# Patient Record
Sex: Male | Born: 1980 | Race: White | Hispanic: No | Marital: Married | Smoking: Never smoker
Health system: Southern US, Community
[De-identification: ages and names within clinical notes are randomized; demographics above are authoritative.]

## PROBLEM LIST (undated history)

## (undated) HISTORY — PX: KNEE SURGERY: SHX244

## (undated) HISTORY — PX: OTHER SURGICAL HISTORY: SHX169

---

## 2005-05-19 ENCOUNTER — Emergency Department (HOSPITAL_COMMUNITY): Admission: EM | Admit: 2005-05-19 | Discharge: 2005-05-19 | Payer: Self-pay | Admitting: Emergency Medicine

## 2008-01-21 ENCOUNTER — Emergency Department (HOSPITAL_COMMUNITY): Admission: EM | Admit: 2008-01-21 | Discharge: 2008-01-21 | Payer: Self-pay | Admitting: Emergency Medicine

## 2009-06-17 ENCOUNTER — Encounter: Admission: RE | Admit: 2009-06-17 | Discharge: 2009-06-17 | Payer: Self-pay | Admitting: Orthopedic Surgery

## 2014-06-13 ENCOUNTER — Emergency Department (HOSPITAL_COMMUNITY): Payer: BC Managed Care – PPO

## 2014-06-13 ENCOUNTER — Encounter (HOSPITAL_COMMUNITY): Payer: Self-pay | Admitting: Emergency Medicine

## 2014-06-13 ENCOUNTER — Emergency Department (HOSPITAL_COMMUNITY)
Admission: EM | Admit: 2014-06-13 | Discharge: 2014-06-13 | Disposition: A | Discharge status: Home / Self Care | Payer: BC Managed Care – PPO | Attending: Emergency Medicine | Admitting: Emergency Medicine

## 2014-06-13 DIAGNOSIS — Y929 Unspecified place or not applicable: Secondary | ICD-10-CM | POA: Diagnosis not present

## 2014-06-13 DIAGNOSIS — S99919A Unspecified injury of unspecified ankle, initial encounter: Secondary | ICD-10-CM | POA: Diagnosis present

## 2014-06-13 DIAGNOSIS — Y9389 Activity, other specified: Secondary | ICD-10-CM | POA: Diagnosis not present

## 2014-06-13 DIAGNOSIS — M25562 Pain in left knee: Secondary | ICD-10-CM

## 2014-06-13 DIAGNOSIS — R000 Tachycardia, unspecified: Secondary | ICD-10-CM | POA: Diagnosis not present

## 2014-06-13 DIAGNOSIS — S8990XA Unspecified injury of unspecified lower leg, initial encounter: Secondary | ICD-10-CM | POA: Insufficient documentation

## 2014-06-13 DIAGNOSIS — Z9889 Other specified postprocedural states: Secondary | ICD-10-CM | POA: Insufficient documentation

## 2014-06-13 DIAGNOSIS — S99929A Unspecified injury of unspecified foot, initial encounter: Principal | ICD-10-CM

## 2014-06-13 DIAGNOSIS — X500XXA Overexertion from strenuous movement or load, initial encounter: Secondary | ICD-10-CM | POA: Diagnosis not present

## 2014-06-13 MED ORDER — OXYCODONE-ACETAMINOPHEN 5-325 MG PO TABS: 1.0000 | ORAL_TABLET | ORAL | Status: DC | PRN

## 2014-06-13 NOTE — Discharge Instructions (Signed)
Continue to take ibuprofen for pain. Elevate, apply ice and wear the knee immobilizer for comfort. Do not drive while taking the narcotic pain medication as it will make you sleepy. Follow up with Dr. Thomasena Edis as planned.

## 2014-06-13 NOTE — ED Provider Notes (Signed)
CSN: [id number]     Arrival date & time 06/13/14  1635 History   First MD Initiated Contact with Patient 06/13/14 1732     Chief Complaint  Patient presents with  . Knee Injury     (Consider location/radiation/quality/duration/timing/severity/associated sxs/prior Treatment) Patient is a 33 y.o. male presenting with knee pain. The history is provided by the patient.  Knee Pain Location:  Knee Time since incident:  45 minutes Knee location:  L knee Pain details:    Quality:  Shooting   Radiates to:  Does not radiate   Onset quality:  Sudden   Timing:  Constant Chronicity:  New Dislocation: yes   Foreign body present:  No foreign bodies Prior injury to area:  No Relieved by:  Nothing Worsened by:  Bearing weight Ineffective treatments:  None tried Stanley Cox is a 33 y.o. male who presents to the ED with left knee pain. He states that approximately 45 minutes prior to arrival to the ED he was getting out of a golf cart and felt his knee "pop out". He was able to pop it back in. Patient has history of same and has had surgery by Dr. Thomasena Edis on the right knee. He continues to have pain and swelling of the left knee.   History reviewed. No pertinent past medical history. Past Surgical History  Procedure Laterality Date  . Knee surgery Right   . Hand Right    History reviewed. No pertinent family history. History  Substance Use Topics  . Smoking status: Never Smoker   . Smokeless tobacco: Not on file  . Alcohol Use: Yes     Comment: occasional    Review of Systems Negative except as stated in HPI   Allergies  Review of patient's allergies indicates not on file.  Home Medications   Prior to Admission medications   Not on File   BP 125/92  Pulse 104  Temp(Src) 98.4 F (36.9 C) (Oral)  Resp 16  Ht  (1.778 m)  Wt 215 lb (97.523 kg)  BMI 30.85 kg/m2  SpO2 100% Physical Exam  Nursing note and vitals reviewed. Constitutional: He is oriented to person,  place, and time. He appears well-developed and well-nourished. No distress.  HENT:  Head: Normocephalic.  Eyes: EOM are normal.  Neck: Neck supple.  Cardiovascular: Tachycardia present.   Pulmonary/Chest: Effort normal.  Musculoskeletal:       Left knee: He exhibits swelling. He exhibits normal range of motion, no ecchymosis, no deformity, no laceration, no erythema and normal alignment. Tenderness found.       Legs: Pedal pulse strong, adequate circulation, good touch sensation. Full flexion and extension. Able to do straight leg raises.   Neurological: He is alert and oriented to person, place, and time. No cranial nerve deficit.  Skin: Skin is warm and dry.  Psychiatric: He has a normal mood and affect. His behavior is normal.    ED Course  Procedures (including critical care time) Labs Review Labs Reviewed - No data to display  Imaging Review Dg Knee Complete 4 Views Left  06/13/2014   CLINICAL DATA:  Left knee pain. Twisted is knee. History of patellar dislocation.  EXAM: LEFT KNEE - COMPLETE 4+ VIEW  COMPARISON:  None.  FINDINGS: The knee is located. The patella is located. There is no significant joint effusion. No acute osseous abnormality is evident.  IMPRESSION: Negative left knee radiographs.   Electronically Signed   By: Gennette Pac M.D.   On:  06/13/2014 17:11   MDM  33 y.o. male with left knee pain s/p injury prior to arrival to the ED. Placed in knee immobilizer, ice, elevation and follow up with Dr. Thomasena Edis. Stable for discharge and remains neurovascularly intact.    [facility] Orlene Och, [place] 06/14/14 [phone]

## 2014-06-13 NOTE — ED Notes (Signed)
Pt reports left knee injury approximately 45 minutes ago. Pt reports felt knee "pop" out and pt reports "popped" back in. No obvious deformity noted in triage. Pt reports increased pain with weight bearing.nad noted.

## 2014-06-13 NOTE — ED Notes (Signed)
Patient with no complaints at this time. Respirations even and unlabored. Skin warm/dry. Discharge instructions reviewed with patient at this time. Patient given opportunity to voice concerns/ask questions. Patient discharged at this time and left Emergency Department with steady gait.   

## 2014-06-14 NOTE — ED Provider Notes (Signed)
Medical screening examination/treatment/procedure(s) were performed by non-physician practitioner and as supervising physician I was immediately available for consultation/collaboration.   EKG Interpretation None        Mete Purdum L Joli Koob, MD 06/14/14 1739 

## 2014-12-11 ENCOUNTER — Other Ambulatory Visit: Payer: Self-pay | Admitting: Orthopedic Surgery

## 2014-12-11 DIAGNOSIS — M545 Low back pain, unspecified: Secondary | ICD-10-CM

## 2014-12-14 ENCOUNTER — Ambulatory Visit
Admission: RE | Admit: 2014-12-14 | Discharge: 2014-12-14 | Disposition: A | Discharge status: Home / Self Care | Payer: 59 | Source: Ambulatory Visit | Attending: Orthopedic Surgery | Admitting: Orthopedic Surgery

## 2014-12-14 DIAGNOSIS — M545 Low back pain, unspecified: Secondary | ICD-10-CM

## 2015-05-08 ENCOUNTER — Encounter (HOSPITAL_COMMUNITY): Payer: Self-pay | Admitting: *Deleted

## 2015-05-08 ENCOUNTER — Emergency Department (HOSPITAL_COMMUNITY)
Admission: EM | Admit: 2015-05-08 | Discharge: 2015-05-08 | Disposition: A | Discharge status: Home / Self Care | Payer: 59 | Attending: Emergency Medicine | Admitting: Emergency Medicine

## 2015-05-08 DIAGNOSIS — Y9389 Activity, other specified: Secondary | ICD-10-CM | POA: Insufficient documentation

## 2015-05-08 DIAGNOSIS — S00561A Insect bite (nonvenomous) of lip, initial encounter: Secondary | ICD-10-CM | POA: Insufficient documentation

## 2015-05-08 DIAGNOSIS — W57XXXA Bitten or stung by nonvenomous insect and other nonvenomous arthropods, initial encounter: Secondary | ICD-10-CM | POA: Insufficient documentation

## 2015-05-08 DIAGNOSIS — Y998 Other external cause status: Secondary | ICD-10-CM | POA: Diagnosis not present

## 2015-05-08 DIAGNOSIS — Y9289 Other specified places as the place of occurrence of the external cause: Secondary | ICD-10-CM | POA: Insufficient documentation

## 2015-05-08 MED ORDER — LIDOCAINE VISCOUS 2 % MT SOLN: 20.0000 mL | OROMUCOSAL | Status: AC | PRN

## 2015-05-08 NOTE — ED Notes (Signed)
Patient stung on L lower lip by wasp about an hour ago.  Significant swelling noted to L lower lip.

## 2015-05-08 NOTE — ED Provider Notes (Signed)
CSN: [id number]     Arrival date & time 05/08/15  1547 History   First MD Initiated Contact with Patient 05/08/15 1553     Chief Complaint  Patient presents with  . Insect Bite     (Consider location/radiation/quality/duration/timing/severity/associated sxs/prior Treatment) Patient is a 34 y.o. male presenting with animal bite.  Animal Bite Contact animal:  Insect Location:  Mouth Mouth injury location:  Lower outer lip Time since incident:  1 hour Pain details:    Quality:  Aching and sharp   Severity:  Mild   Timing:  Constant   Progression:  Worsening Incident location:  Home Provoked: unprovoked   Worsened by:  Nothing tried Ineffective treatments:  OTC medications Associated symptoms: no fever and no rash     History reviewed. No pertinent past medical history. Past Surgical History  Procedure Laterality Date  . Knee surgery Right   . Hand Right    History reviewed. No pertinent family history. History  Substance Use Topics  . Smoking status: Never Smoker   . Smokeless tobacco: Not on file  . Alcohol Use: Yes     Comment: occasional    Review of Systems  Constitutional: Negative for fever.  HENT:       Left lower lip swelling  Respiratory: Negative for cough, chest tightness, shortness of breath, wheezing and stridor.   Cardiovascular: Negative for chest pain.  Gastrointestinal: Negative for nausea, vomiting, abdominal pain and rectal pain.  Musculoskeletal: Negative for back pain and neck pain.  Skin: Negative for rash and wound.      Allergies  Review of patient's allergies indicates no known allergies.  Home Medications   Prior to Admission medications   Medication Sig Start Date End Date Taking? Authorizing Provider  ALPRAZolam Prudy Feeler) 1 MG tablet Take 1 tablet by mouth 2 (two) times daily as needed. anxiety 04/27/15  Yes Historical Provider, MD  naproxen sodium (ANAPROX) 220 MG tablet Take 440 mg by mouth 2 (two) times daily as needed (pain).    Yes Historical Provider, MD  lidocaine (XYLOCAINE) 2 % solution Use as directed 20 mLs in the mouth or throat every 3 (three) hours as needed for mouth pain. Swish around painful area for 15-20 seconds then spit out. 05/08/15   Marily Memos, MD  oxyCODONE-acetaminophen (ROXICET) 5-325 MG per tablet Take 1 tablet by mouth every 4 (four) hours as needed for severe pain. Patient not taking: Reported on 05/08/2015 06/13/14   Janne Napoleon, NP   BP 141/94 mmHg  Pulse 78  Temp(Src) 97.9 F (36.6 C) (Oral)  Resp 22  Ht  (1.803 m)  Wt 230 lb (104.327 kg)  BMI 32.09 kg/m2  SpO2 98% Physical Exam  Constitutional: He appears well-developed and well-nourished.  HENT:  Head: Atraumatic.  Left lower lip with significant swelling, no other airway involvement  Cardiovascular: Normal rate and regular rhythm.   Pulmonary/Chest: No respiratory distress. He has no wheezes. He has no rales.  Nursing note and vitals reviewed.   ED Course  Procedures (including critical care time) Labs Review Labs Reviewed - No data to display  Imaging Review No results found.   EKG Interpretation None      MDM   Final diagnoses:  Insect bite   Social mouth in the lip by a wasp just prior to arrival with his lip. On exam no obvious stridor antral swelling spread of the swelling. Observed immersed for an hour to ensure no progression and still with out any  dyspnea stridor, or difficulty breathing, or difficulty swallowing. It is likely a localized inflammatory reaction patient is stable for discharge home. Discussed with the patient signs of anaphylaxis and gave him a handout for the same and he will return for any signs or symptoms of anaphylactic reaction.  I have personally and contemperaneously reviewed labs and imaging and used in my decision making as above.   I feel the patient has had an appropriate workup for their chief complaint at this time and likelihood of emergent condition existing is low.  They have been counseled on decision, discharge, follow up and which symptoms necessitate immediate return to the emergency department. They or their family verbally stated understanding and agreement with plan and discharged in stable condition.       Marily Memos, MD 05/08/15 1725

## 2015-05-20 ENCOUNTER — Encounter: Payer: Self-pay | Admitting: Orthopedic Surgery

## 2015-05-26 ENCOUNTER — Ambulatory Visit (INDEPENDENT_AMBULATORY_CARE_PROVIDER_SITE_OTHER): Payer: 59 | Admitting: Orthopedic Surgery

## 2015-05-26 ENCOUNTER — Encounter: Payer: Self-pay | Admitting: Orthopedic Surgery

## 2015-05-26 VITALS — BP 130/89 | Ht 71.0 in | Wt 230.0 lb

## 2015-05-26 DIAGNOSIS — M545 Low back pain, unspecified: Secondary | ICD-10-CM | POA: Insufficient documentation

## 2015-05-26 DIAGNOSIS — M25369 Other instability, unspecified knee: Secondary | ICD-10-CM | POA: Diagnosis not present

## 2015-05-26 DIAGNOSIS — M25569 Pain in unspecified knee: Secondary | ICD-10-CM

## 2015-05-26 DIAGNOSIS — M75101 Unspecified rotator cuff tear or rupture of right shoulder, not specified as traumatic: Secondary | ICD-10-CM

## 2015-05-26 NOTE — Progress Notes (Signed)
Patient ID: Stanley Cox, male   DOB: 26-Feb-1981, 34 y.o.   MRN: [id number]  No chief complaint on file.   HPI Stanley Cox is a 34 y.o. male.  Presents for second opinion regarding his right knee and his right shoulder  Referred by Dr. Doristine Counter  We will start with the right shoulder. Pain 2 years no injury. Experiences catching and sharp stabbing pain in abduction external rotation. Pain is worse at night pain is worse with certain motions. Pain is 8-9 out of 10. Pain is worse when he lies on his right shoulder lifting certain objects.  Prior treatment 3 injections. Workup included x-ray and MRI MRI shows chronic tendinosis of the rotator cuff with no labral pathology.  Right knee multiple dislocations previous surgery x 2 = included lateral release medial reefing. Presents with pain swelling catching giving way loss of motion and inability to stand right knee.  Patient has also seen Dr. Shon Baton for pain in his hip/back which was worked up and found to be negative for any surgical pathology.  Right knee he says he can't stand or walk for long periods of time and has pain on steps going up hills and sitting in one place for a long time. Prior x-rays and MRIs been done he's had injections physical therapy and surgery as described. He says that Dr. Thomasena Edis has indicated he may need further surgery on his right and left knee  He wish to transfer care but we set this up as a second opinion.    Review of Systems Review of Systems 1. Sinusitis, numbness tingling burning pain in his legs 2. Joint pain muscle weakness gait disturbance stiff joints swollen joints back pain all other systems were negative   No past medical history on file.  Past Surgical History  Procedure Laterality Date  . Knee surgery Right   . Hand Right     Social History Social History  Substance Use Topics  . Smoking status: Never Smoker   . Smokeless tobacco: None  . Alcohol Use: Yes     Comment: occasional     No Known Allergies  Current Outpatient Prescriptions  Medication Sig Dispense Refill  . ALPRAZolam (XANAX) 1 MG tablet Take 1 tablet by mouth 2 (two) times daily as needed. anxiety    . naproxen sodium (ANAPROX) 220 MG tablet Take 440 mg by mouth 2 (two) times daily as needed (pain).    Marland Kitchen lidocaine (XYLOCAINE) 2 % solution Use as directed 20 mLs in the mouth or throat every 3 (three) hours as needed for mouth pain. Swish around painful area for 15-20 seconds then spit out. (Patient not taking: Reported on 05/26/2015) 200 mL 0  . oxyCODONE-acetaminophen (ROXICET) 5-325 MG per tablet Take 1 tablet by mouth every 4 (four) hours as needed for severe pain. (Patient not taking: Reported on 05/08/2015) 20 tablet 0   No current facility-administered medications for this visit.      Physical Exam Physical Exam Blood pressure 130/89, height  (1.803 m), weight 230 lb (104.327 kg).  Gen. appearance healthy-appearing male well-developed well-nourished The patient is alert and oriented person place and time Mood is normal affect is normal Ambulatory status normal  Exam of the lumbar spine midline lower back tenderness L5-S1 L4-5 right and left paraspinous muscle tenderness negative straight leg raises no range of motion restrictions  Inspection right shoulder pain with abduction external rotation decreased internal rotation versus a left shoulder by 5 levels ROM full passive  and active in both shoulders Stability normal apprehension in both shoulders Strength normal cuff strength on the left abnormal on the right supraspinatus  Skin: In both shoulders Pulses: Normal pulses both hands and wrists capillary refill Neuro: Normal neurologic exam bilaterally upper extremities   Data Reviewed MRI of the shoulder and of the knee were reviewed as stated above reports are reviewed and included. He also has multiple office notes and this documents a history that he gave.  Assessment    I see  several things here. He needs to have some therapy on his lumbar spine for his dysfunctional back. While it is nonsurgical he has not undergone adequate core strengthening and lumbar stabilization  Second thing I see is a chronic problem in the right knee. This will never be a normal knee. The cartilage transplant for the patella is probably not as successful as tibiofemoral cartilage transplants but he should entertain this thought and in any event because of his age and loss of function. He will never have a normal knee. 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  As far as his shoulder goes he has documented MRI that he does not have a cuff or labral tear. He seems to have classic chronic rotator cuff syndrome and rotator cuff dysfunction and needs rotator cuff strengthening he needs capsular stretching to address his internal rotation deficit  He will continue with [facility] orthopedics at this point and we will go ahead and order his therapy for his back and shoulder but we have not taken over his care    Plan    He will continue with [facility] orthopedics at this point and we will go ahead and order his therapy for his back and shoulder but we have not taken over his care        Fuller Canada 05/26/2015, 10:25 AM

## 2015-05-26 NOTE — Addendum Note (Signed)
Addended by: Vickki Hearing on: 05/26/2015 10:35 AM   Modules accepted: Orders

## 2015-06-10 ENCOUNTER — Encounter (HOSPITAL_COMMUNITY): Payer: Self-pay | Admitting: Occupational Therapy

## 2015-06-10 ENCOUNTER — Ambulatory Visit (HOSPITAL_COMMUNITY): Payer: 59 | Attending: Orthopedic Surgery | Admitting: Occupational Therapy

## 2015-06-10 DIAGNOSIS — M75101 Unspecified rotator cuff tear or rupture of right shoulder, not specified as traumatic: Secondary | ICD-10-CM

## 2015-06-10 DIAGNOSIS — M25511 Pain in right shoulder: Secondary | ICD-10-CM

## 2015-06-10 NOTE — Therapy (Signed)
[facility] [address] [place], [place], [address] Phone: [phone]   Fax:  [phone]  Occupational Therapy Evaluation  Patient Details  Name: Stanley Cox MRN: [id number] Date of Birth: 1980-12-31 Referring Provider:  Vickki Hearing, MD  Encounter Date: 06/10/2015      OT End of Session - 06/10/15 1245    Visit Number 1   Number of Visits 1   Date for OT Re-Evaluation 08/09/15   OT Start Time 0930   OT Stop Time 1008   OT Time Calculation (min) 38 min   Activity Tolerance Patient tolerated treatment well   Behavior During Therapy [facility] for tasks assessed/performed      History reviewed. No pertinent past medical history.  Past Surgical History  Procedure Laterality Date  . Knee surgery Right   . Hand Right     There were no vitals filed for this visit.  Visit Diagnosis:  Rotator cuff syndrome, right  Pain in right shoulder      Subjective Assessment - 06/10/15 1240    Subjective  S: Sometimes I get pain in my shoulder if I move it the wrong way.    Pertinent History Pt is a 34 y/o male with right rotator cuff syndrome presenting with pain in his right shoulder occurring intermittently. Pt reports one doctor said it was arthritis and one doctor thought it was a labrum tear. Pt went to Dr. Romeo Apple for second opinion.  Dr. Romeo Apple referred pt to occupational therapy evaluation and treatment.    Patient Stated Goals To lessen this pain   Currently in Pain? No/denies           [facility] OT Assessment - 06/10/15 0925    Assessment   Diagnosis Right rotator cuff syndrome   Onset Date --  approximately 2 years   Prior Therapy none   Precautions   Precautions Knee   Precaution Comments no standing for too long   Balance Screen   Has the patient fallen in the past 6 months No   Has the patient had a decrease in activity level because of a fear of falling?  No   Is the patient reluctant to leave their home  because of a fear of falling?  No   Home  Environment   Family/patient expects to be discharged to: Private residence   Living Arrangements Spouse/significant other   Prior Function   Level of Independence Independent with basic ADLs   Vocation On disability   Leisure golf   ADL   ADL comments Pt reports he is able to complete B/IADL tasks independently, however is pain limited at times. He states he has pain of 9-10 at night and in the mornings.    Written Expression   Dominant Hand Right   Cognition   Overall Cognitive Status Within Functional Limits for tasks assessed   ROM / Strength   AROM / PROM / Strength AROM;PROM;Strength   AROM   Overall AROM  Within functional limits for tasks performed   Overall AROM Comments WNL   AROM Assessment Site Shoulder   Right/Left Shoulder Right   PROM   Overall PROM Comments WNL   PROM Assessment Site Shoulder   Right/Left Shoulder Right   Strength   Strength Assessment Site Shoulder   Right/Left Shoulder Right   Right Shoulder Flexion 5/5   Right Shoulder ABduction 5/5   Right Shoulder Internal Rotation 5/5   Right Shoulder External Rotation 5/5  OT Treatments/Exercises (OP) - 06/10/15 1250    Exercises   Exercises Shoulder   Shoulder Exercises: Standing   Other Standing Exercises Educated pt on HEP including: shoulder stretching, red scapular theraband exercises, AROM exercises               OT Education - 06/10/15 1244    Education provided Yes   Education Details shoulder stretches, red theraband scapular exercises, AROM exercises   Person(s) Educated Patient   Methods Explanation;Demonstration;Handout   Comprehension Verbalized understanding;Returned demonstration          OT Short Term Goals - 06/10/15 1249    OT SHORT TERM GOAL #1   Title Provide and educate pt on HEP.    Time 1   Period Days   Status Achieved                  Plan - 06/10/15 1245    Clinical  Impression Statement A: Pt is a 34 y/o male with right rotator cuff syndrome presenting with increased pain during B/IADL tasks. Pt reports pain of 9-10 at night and in the mornings, less throughout the day. Pt is unable to work at this time due to additional health conditions. At times pt reports he is unable to lift his arm due to pain, especially in the mornings. Pt was referred by Dr. Romeo Apple for a home exercise program.    Pt will benefit from skilled therapeutic intervention in order to improve on the following deficits (Retired) Pain;Impaired UE functional use   Rehab Potential Good   OT Frequency 1x / week   OT Duration --  1 visit   OT Treatment/Interventions Patient/family education   Plan P: Provide pt with and educate pt on HEP targeting rotator cuff syndrome.    OT Home Exercise Plan shoulder stretches, red scapular theraband exercises, AROM exercises.    Consulted and Agree with Plan of Care Patient        Problem List Patient Active Problem List   Diagnosis Date Noted  . Patellofemoral misalignment with pain 05/26/2015  . Rotator cuff syndrome of right shoulder 05/26/2015  . Midline low back pain without sciatica 05/26/2015    Ezra Sites, OTR/L  [phone]  06/10/2015, 12:51 PM  [place] [facility] [address] [place], [place], [address] Phone: [phone]   Fax:  [phone]

## 2015-06-10 NOTE — Patient Instructions (Addendum)
  1) Flexion Wall Stretch    Face wall, place affected handon wall in front of you. Slide hand up the wall  and lean body in towards the wall. Hold for 5 seconds. Repeat 10 times. 1-2 times/day.     2) Towel Stretch with Internal Rotation   Gently pull up your affected arm  behind your back with the assist of a towel            3) Corner Stretch    Stand at a corner of a wall, place your arms on the walls with elbows bent. Lean into the corner until a stretch is felt along the front of your chest and/or shoulders. Hold for 5 seconds. Repeat 10X, 1-2 times/day.    4) Posterior Capsule Stretch    Bring the involved arm across chest. Grasp elbow and pull toward chest until you feel a stretch in the back of the upper arm and shoulder. Hold 5 seconds. Repeat 10X. Complete 1-2 times/day.    5) Scapular Retraction    Tuck chin back as you pinch shoulder blades together.  Hold 5 seconds. Repeat 10X. Complete 1-2 times/day.    (Home) Extension: Isometric / Bilateral Arm Retraction - Sitting   Facing anchor, hold hands and elbow at shoulder height, with elbow bent.  Pull arms back to squeeze shoulder blades together. Repeat 10-15 times.  Copyright  VHI. All rights reserved.   (Home) Retraction: Row - Bilateral (Anchor)   Facing anchor, arms reaching forward, pull hands toward stomach, keeping elbows bent and at your sides and pinching shoulder blades together. Repeat 10-15 times.  Copyright  VHI. All rights reserved.   (Clinic) Extension / Flexion (Assist)   Face anchor, pull arms back, keeping elbow straight, and squeze shoulder blades together. Repeat 10-15 times.   Copyright  VHI. All rights reserved.    1) Shoulder Protraction    Begin with elbows by your side, slowly "punch" straight out in front of you keeping arms/elbows straight. Repeat 10-15___times, __1-2__set/day.     2) Shoulder Flexion  Supine:     Standing:         Begin with  arms at your side with thumbs pointed up, slowly raise both arms up and forward towards overhead. Repeat_10-15__times, _1-2__set/day.               3) Horizontal abduction/adduction  Supine:   Standing:           Begin with arms straight out in front of you, bring out to the side in at "T" shape. Keep arms straight entire time. Repeat __10-15__times, _1-2___sets/day.                 4) Internal & External Rotation    *No band* -Stand with elbows at the side and elbows bent 90 degrees. Move your forearms away from your body, then bring back inward toward the body.  Repeat 10-15___times, _1-2__sets/day    5) Shoulder Abduction  Supine:     Standing:       Lying on your back begin with your arms flat on the table next to your side. Slowly move your arms out to the side so that they go overhead, in a jumping jack or snow angel movement. Repeat _10-15__times, _1-2__sets/day

## 2015-08-11 ENCOUNTER — Encounter: Payer: Self-pay | Admitting: Orthopedic Surgery

## 2016-01-14 ENCOUNTER — Encounter (HOSPITAL_COMMUNITY): Payer: Self-pay

## 2016-07-13 ENCOUNTER — Encounter (HOSPITAL_COMMUNITY): Payer: Self-pay

## 2016-07-13 ENCOUNTER — Emergency Department (HOSPITAL_COMMUNITY): Payer: BLUE CROSS/BLUE SHIELD

## 2016-07-13 ENCOUNTER — Emergency Department (HOSPITAL_COMMUNITY)
Admission: EM | Admit: 2016-07-13 | Discharge: 2016-07-13 | Disposition: A | Discharge status: Home / Self Care | Payer: BLUE CROSS/BLUE SHIELD | Attending: Emergency Medicine | Admitting: Emergency Medicine

## 2016-07-13 DIAGNOSIS — Z79899 Other long term (current) drug therapy: Secondary | ICD-10-CM | POA: Insufficient documentation

## 2016-07-13 DIAGNOSIS — M25562 Pain in left knee: Secondary | ICD-10-CM | POA: Diagnosis not present

## 2016-07-13 MED ORDER — KETOROLAC TROMETHAMINE 60 MG/2ML IM SOLN
60.0000 mg | Freq: Once | INTRAMUSCULAR | Status: AC
  Administered 2016-07-13: 60 mg via INTRAMUSCULAR
  Filled 2016-07-13: qty 2

## 2016-07-13 MED ORDER — NAPROXEN 500 MG PO TABS: ORAL_TABLET | ORAL | 0 refills | Status: AC

## 2016-07-13 NOTE — Discharge Instructions (Signed)
Wear the knee immobilizer for comfort. Take the naproxen for pain. Use ice packs as needed. He should follow-up with Dr. Thomasena Edisollins about the problems you're having in your left knee now.

## 2016-07-13 NOTE — ED Triage Notes (Signed)
Chronic knee problems, has been seeing an ortho for same, states his left knee has been especially painful recently, no new injury

## 2016-07-13 NOTE — ED Provider Notes (Signed)
[facility] DEPT Provider Note   CSN: [id number] Arrival date & time: 07/13/16  0250  Time seen 04:35 AM   History   Chief Complaint Chief Complaint  Patient presents with  . Knee Pain    HPI Stanley Cox is a 35 y.o. male.  HPI patient reports he has had knee problems for years. He has had multiple surgeries on his right knee and states he now is going to need similar surgeries on his left. 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 He is followed by Dr. Thomasena Edis, [facility] orthopedics. 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 He states tonight he fell asleep in the recliner for about an hour and when he got up to go to bed his left knee was hurting him. He states he has diffuse pain. This started about midnight. He states he feels like he has numbness in his left foot and his left hip. He denies any known injury today or recently. The only other thing is he has had increased walking due to his new job.  PCP [facility] Dr Thomasena Edis Psychiatry Dr Evelene Croon  History reviewed. No pertinent past medical history.  Patient Active Problem List   Diagnosis Date Noted  . Patellofemoral misalignment with pain 05/26/2015  . Rotator cuff syndrome of right shoulder 05/26/2015  . Midline low back pain without sciatica 05/26/2015    Past Surgical History:  Procedure Laterality Date  . hand Right   . KNEE SURGERY Right        Home Medications    Prior to Admission medications   Medication Sig Start Date End Date Taking? Authorizing Provider  clonazePAM (KLONOPIN) 2 MG tablet Take 2 mg by mouth 2 (two) times daily.   Yes Historical Provider, MD  FLUoxetine (PROZAC) 20 MG tablet Take 60 mg by mouth daily.   Yes Historical Provider, MD  ALPRAZolam Prudy Feeler) 1 MG tablet Take 1 tablet by mouth 2 (two)  times daily as needed. anxiety 04/27/15   Historical Provider, MD  lidocaine (XYLOCAINE) 2 % solution Use as directed 20 mLs in the mouth or throat every 3 (three) hours as needed for mouth pain. Swish around painful area for 15-20 seconds then spit out. Patient not taking: Reported on 05/26/2015 05/08/15   Marily Memos, MD  naproxen (NAPROSYN) 500 MG tablet Take 1 po BID with food prn pain 07/13/16   Devoria Albe, MD  naproxen sodium (ANAPROX) 220 MG tablet Take 440 mg by mouth 2 (two) times daily as needed (pain).    Historical Provider, MD  oxyCODONE-acetaminophen (ROXICET) 5-325 MG per tablet Take 1 tablet by mouth every 4 (four) hours as needed for severe pain. Patient not taking: Reported on 05/08/2015 06/13/14   Janne Napoleon, NP    Family History No family history on file.  Social History Social History  Substance Use Topics  . Smoking status: Never Smoker  . Smokeless tobacco: Never Used  . Alcohol use Yes     Comment: occasional  employed   Allergies   Review of patient's allergies indicates no known allergies.   Review of Systems Review of Systems  All other systems reviewed and are negative.    Physical Exam Updated Vital Signs BP 135/96 (BP Location: Left Arm)   Pulse 92   Temp 98.4 F (  36.9 C) (Oral)   Resp 18   Ht 5\' 11"  (1.803 m)   Wt 200 lb (90.7 kg)   SpO2 95%   BMI 27.89 kg/m   Vital signs normal    Physical Exam  Constitutional: He is oriented to person, place, and time. He appears well-developed and well-nourished.  Non-toxic appearance. He does not appear ill. No distress.  HENT:  Head: Normocephalic and atraumatic.  Right Ear: External ear normal.  Left Ear: External ear normal.  Nose: Nose normal. No mucosal edema or rhinorrhea.  Mouth/Throat: Mucous membranes are normal. No dental abscesses or uvula swelling.  Eyes: Conjunctivae and EOM are normal.  Neck: Normal range of motion and full passive range of motion without pain.  Cardiovascular:  Normal rate.   Pulmonary/Chest: Effort normal. No respiratory distress. He has no rhonchi. He exhibits no crepitus.  Abdominal: Normal appearance.  Musculoskeletal: He exhibits tenderness. He exhibits no edema.  Patient's right knee appears larger than his left. He has some mild diffuse swelling. He has some surgical scars present. On his left knee has patella is very lax and easily moved around. He does not have a joint effusion. He's tender diffusely in his medial and lateral joint space of the left knee.  Neurological: He is alert and oriented to person, place, and time. He has normal strength. No cranial nerve deficit.  Skin: Skin is warm, dry and intact. No rash noted. No erythema. No pallor.  Psychiatric: He has a normal mood and affect. His speech is normal and behavior is normal. His mood appears not anxious.  Nursing note and vitals reviewed.    ED Treatments / Results   Radiology Dg Knee Complete 4 Views Left  Result Date: 07/13/2016 CLINICAL DATA:  35 year old male with chronic left knee pain. No knee injury. EXAM: LEFT KNEE - COMPLETE 4+ VIEW COMPARISON:  Left knee radiograph dated 06/13/2014 FINDINGS: No evidence of fracture, dislocation, or joint effusion. No evidence of arthropathy or other focal bone abnormality. Soft tissues are unremarkable. IMPRESSION: Negative. Electronically Signed   By: Elgie CollardArash  Radparvar M.D.   On: 07/13/2016 03:43    Procedures Procedures (including critical care time)  Medications Ordered in ED Medications  ketorolac (TORADOL) injection 60 mg (60 mg Intramuscular Given 07/13/16 0451)     Initial Impression / Assessment and Plan / ED Course  I have reviewed the triage vital signs and the nursing notes.  Pertinent labs & imaging results that were available during my care of the patient were reviewed by me and considered in my medical decision making (see chart for details).  Clinical Course   Patient was given Toradol IM. He was placed in a  knee immobilizer.Marland Kitchen. He will be referred back to Dr. Thomasena Edisollins.  Final Clinical Impressions(s) / ED Diagnoses   Final diagnoses:  Knee pain, left    New Prescriptions New Prescriptions   NAPROXEN (NAPROSYN) 500 MG TABLET    Take 1 po BID with food prn pain    Plan discharge  Devoria AlbeIva Jesselyn Rask, MD, [facility] PyoFACEP    Zimere Dunlevy, MD 07/13/16 [phone]

## 2016-07-26 ENCOUNTER — Emergency Department (HOSPITAL_COMMUNITY)
Admission: EM | Admit: 2016-07-26 | Discharge: 2016-07-26 | Disposition: A | Discharge status: Home / Self Care | Payer: BLUE CROSS/BLUE SHIELD | Attending: Emergency Medicine | Admitting: Emergency Medicine

## 2016-07-26 ENCOUNTER — Encounter (HOSPITAL_COMMUNITY): Payer: Self-pay | Admitting: Emergency Medicine

## 2016-07-26 ENCOUNTER — Emergency Department (HOSPITAL_COMMUNITY): Payer: BLUE CROSS/BLUE SHIELD

## 2016-07-26 DIAGNOSIS — S99912A Unspecified injury of left ankle, initial encounter: Secondary | ICD-10-CM | POA: Diagnosis present

## 2016-07-26 DIAGNOSIS — Y999 Unspecified external cause status: Secondary | ICD-10-CM | POA: Insufficient documentation

## 2016-07-26 DIAGNOSIS — W109XXA Fall (on) (from) unspecified stairs and steps, initial encounter: Secondary | ICD-10-CM | POA: Diagnosis not present

## 2016-07-26 DIAGNOSIS — Z79899 Other long term (current) drug therapy: Secondary | ICD-10-CM | POA: Insufficient documentation

## 2016-07-26 DIAGNOSIS — W19XXXA Unspecified fall, initial encounter: Secondary | ICD-10-CM

## 2016-07-26 DIAGNOSIS — S93402A Sprain of unspecified ligament of left ankle, initial encounter: Secondary | ICD-10-CM | POA: Diagnosis not present

## 2016-07-26 DIAGNOSIS — Y939 Activity, unspecified: Secondary | ICD-10-CM | POA: Insufficient documentation

## 2016-07-26 DIAGNOSIS — Y929 Unspecified place or not applicable: Secondary | ICD-10-CM | POA: Insufficient documentation

## 2016-07-26 MED ORDER — OXYCODONE-ACETAMINOPHEN 5-325 MG PO TABS
1.0000 | ORAL_TABLET | Freq: Once | ORAL | Status: AC
  Administered 2016-07-26: 1 via ORAL
  Filled 2016-07-26: qty 1

## 2016-07-26 MED ORDER — OXYCODONE-ACETAMINOPHEN 5-325 MG PO TABS: 1.0000 | ORAL_TABLET | ORAL | 0 refills | Status: AC | PRN

## 2016-07-26 NOTE — Discharge Instructions (Signed)
You may take the percocet given for pain relief.  This will make you drowsy - do not drive within 4 hours of taking this medication.

## 2016-07-26 NOTE — ED Triage Notes (Signed)
Pt states he slipped down some steps and c/o left ankle pain. Pt has abrasions to bilateral knees.

## 2016-07-27 NOTE — ED Provider Notes (Signed)
[facility] Provider Note   CSN: [id number] Arrival date & time: 07/26/16  1931     History   Chief Complaint Chief Complaint  Patient presents with  . Ankle Injury    HPI Stanley Cox is a 35 y.o. male presenting with left ankle pain and inversion injury after slipping down 3 steps into his garage just prior to arrival causing injury.  He also sustained an abrasion to his anterior right knee but denies significant pain on this side. He incidentally is scheduled for a surgical procedure of his left knee in the morning by Dr. Thomasena Edisollins to repair chronic patellofemoral misalignment problems in the knee.  He contacted his surgeon who advised xrays and if no fractures, to continue with plan for surgery in the am.  He has used ice packs at home, but has found no significant relief.  He is able to weight bear on the right.   The history is provided by the patient and the spouse.    History reviewed. No pertinent past medical history.  Patient Active Problem List   Diagnosis Date Noted  . Patellofemoral misalignment with pain 05/26/2015  . Rotator cuff syndrome of right shoulder 05/26/2015  . Midline low back pain without sciatica 05/26/2015    Past Surgical History:  Procedure Laterality Date  . hand Right   . KNEE SURGERY Right        Home Medications    Prior to Admission medications   Medication Sig Start Date End Date Taking? Authorizing Provider  ALPRAZolam Prudy Feeler(XANAX) 1 MG tablet Take 1 tablet by mouth 2 (two) times daily as needed. anxiety 04/27/15   Historical Provider, MD  clonazePAM (KLONOPIN) 2 MG tablet Take 2 mg by mouth 2 (two) times daily.    Historical Provider, MD  FLUoxetine (PROZAC) 20 MG tablet Take 60 mg by mouth daily.    Historical Provider, MD  lidocaine (XYLOCAINE) 2 % solution Use as directed 20 mLs in the mouth or throat every 3 (three) hours as needed for mouth pain. Swish around painful area for 15-20 seconds then spit out. Patient not taking:  Reported on 05/26/2015 05/08/15   Marily MemosJason Mesner, MD  naproxen (NAPROSYN) 500 MG tablet Take 1 po BID with food prn pain 07/13/16   Devoria AlbeIva Knapp, MD  naproxen sodium (ANAPROX) 220 MG tablet Take 440 mg by mouth 2 (two) times daily as needed (pain).    Historical Provider, MD  oxyCODONE-acetaminophen (PERCOCET/ROXICET) 5-325 MG tablet Take 1 tablet by mouth every 4 (four) hours as needed for severe pain. 07/26/16   Burgess AmorJulie Jospeh Mangel, PA-C    Family History No family history on file.  Social History Social History  Substance Use Topics  . Smoking status: Never Smoker  . Smokeless tobacco: Never Used  . Alcohol use Yes     Comment: occasional     Allergies   Review of patient's allergies indicates no known allergies.   Review of Systems Review of Systems  Musculoskeletal: Positive for arthralgias and joint swelling.  Skin: Negative for wound.  Neurological: Negative for weakness and numbness.     Physical Exam Updated Vital Signs BP 127/87 (BP Location: Left Arm)   Pulse 119   Temp 98.6 F (37 C) (Oral)   Resp 20   Ht 5\' 11"  (1.803 m)   Wt 93 kg   SpO2 97%   BMI 28.59 kg/m   Physical Exam  Constitutional: He appears well-developed and well-nourished.  HENT:  Head: Normocephalic.  Cardiovascular: Normal  rate and intact distal pulses.  Exam reveals no decreased pulses.   Pulses:      Dorsalis pedis pulses are 2+ on the right side, and 2+ on the left side.       Posterior tibial pulses are 2+ on the right side, and 2+ on the left side.  Musculoskeletal: He exhibits edema and tenderness.       Right knee: He exhibits no effusion and no ecchymosis. No tenderness found.       Left ankle: He exhibits swelling. He exhibits no ecchymosis, no deformity and normal pulse. Tenderness. Lateral malleolus tenderness found. No head of 5th metatarsal and no proximal fibula tenderness found. Achilles tendon normal.       Legs: Neurological: He is alert. No sensory deficit.  Skin: Skin is warm,  dry and intact.  Nursing note and vitals reviewed.    ED Treatments / Results  Labs (all labs ordered are listed, but only abnormal results are displayed) Labs Reviewed - No data to display  EKG  EKG Interpretation None       Radiology Dg Ankle Complete Left  Result Date: 07/26/2016 CLINICAL DATA:  Left ankle pain and swelling after fall EXAM: LEFT ANKLE COMPLETE - 3+ VIEW COMPARISON:  None. FINDINGS: Moderate lateral soft tissue swelling. No acute displaced fracture or malalignment. Ankle mortise is symmetric. IMPRESSION: No acute osseous abnormality.  Lateral soft tissue swelling Electronically Signed   By: Jasmine Pang M.D.   On: 07/26/2016 20:00    Procedures Procedures (including critical care time)  Medications Ordered in ED Medications  oxyCODONE-acetaminophen (PERCOCET/ROXICET) 5-325 MG per tablet 1 tablet (1 tablet Oral Given 07/26/16 2129)     Initial Impression / Assessment and Plan / ED Course  I have reviewed the triage vital signs and the nursing notes.  Pertinent labs & imaging results that were available during my care of the patient were reviewed by me and considered in my medical decision making (see chart for details).  Clinical Course    Imaging reviewed and discussed with patient.  ASO provided, ice,  Elevation. F/u with Dr Thomasena Edis tomorrow as planned.  He was given an oxycodone prepack for overnight use prn pain.   Final Clinical Impressions(s) / ED Diagnoses   Final diagnoses:  Fall  Sprain of left ankle, unspecified ligament, initial encounter    New Prescriptions Discharge Medication List as of 07/26/2016  8:56 PM       Burgess Amor, PA-C 07/27/16 0140    Bethann Berkshire, MD 07/28/16 1232

## 2016-08-01 MED FILL — Oxycodone w/ Acetaminophen Tab 5-325 MG: ORAL | Qty: 6 | Status: AC

## 2016-11-17 IMAGING — DX DG ANKLE COMPLETE 3+V*L*
3 series · 3 of 3 positions shown · non-contrast
Comparison: None.

CLINICAL DATA: Left ankle pain and swelling after fall

EXAM:
LEFT ANKLE COMPLETE - 3+ VIEW

[ankle ap]
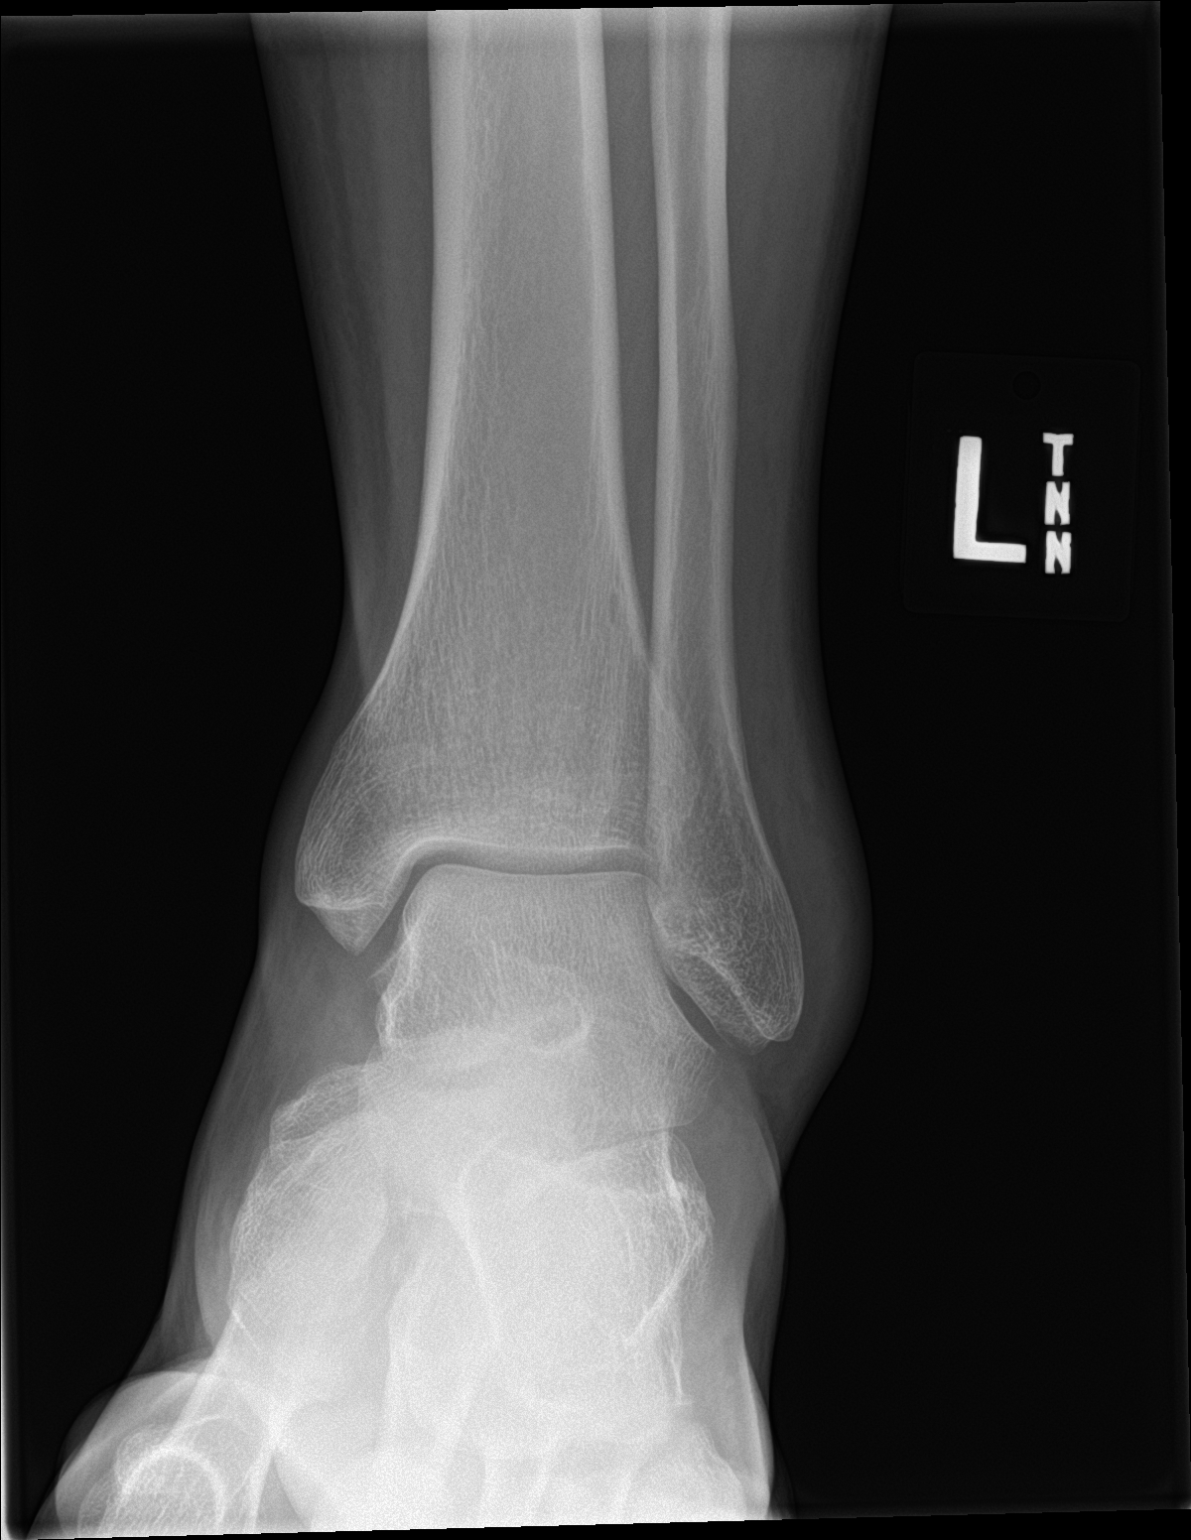

[ankle obl]
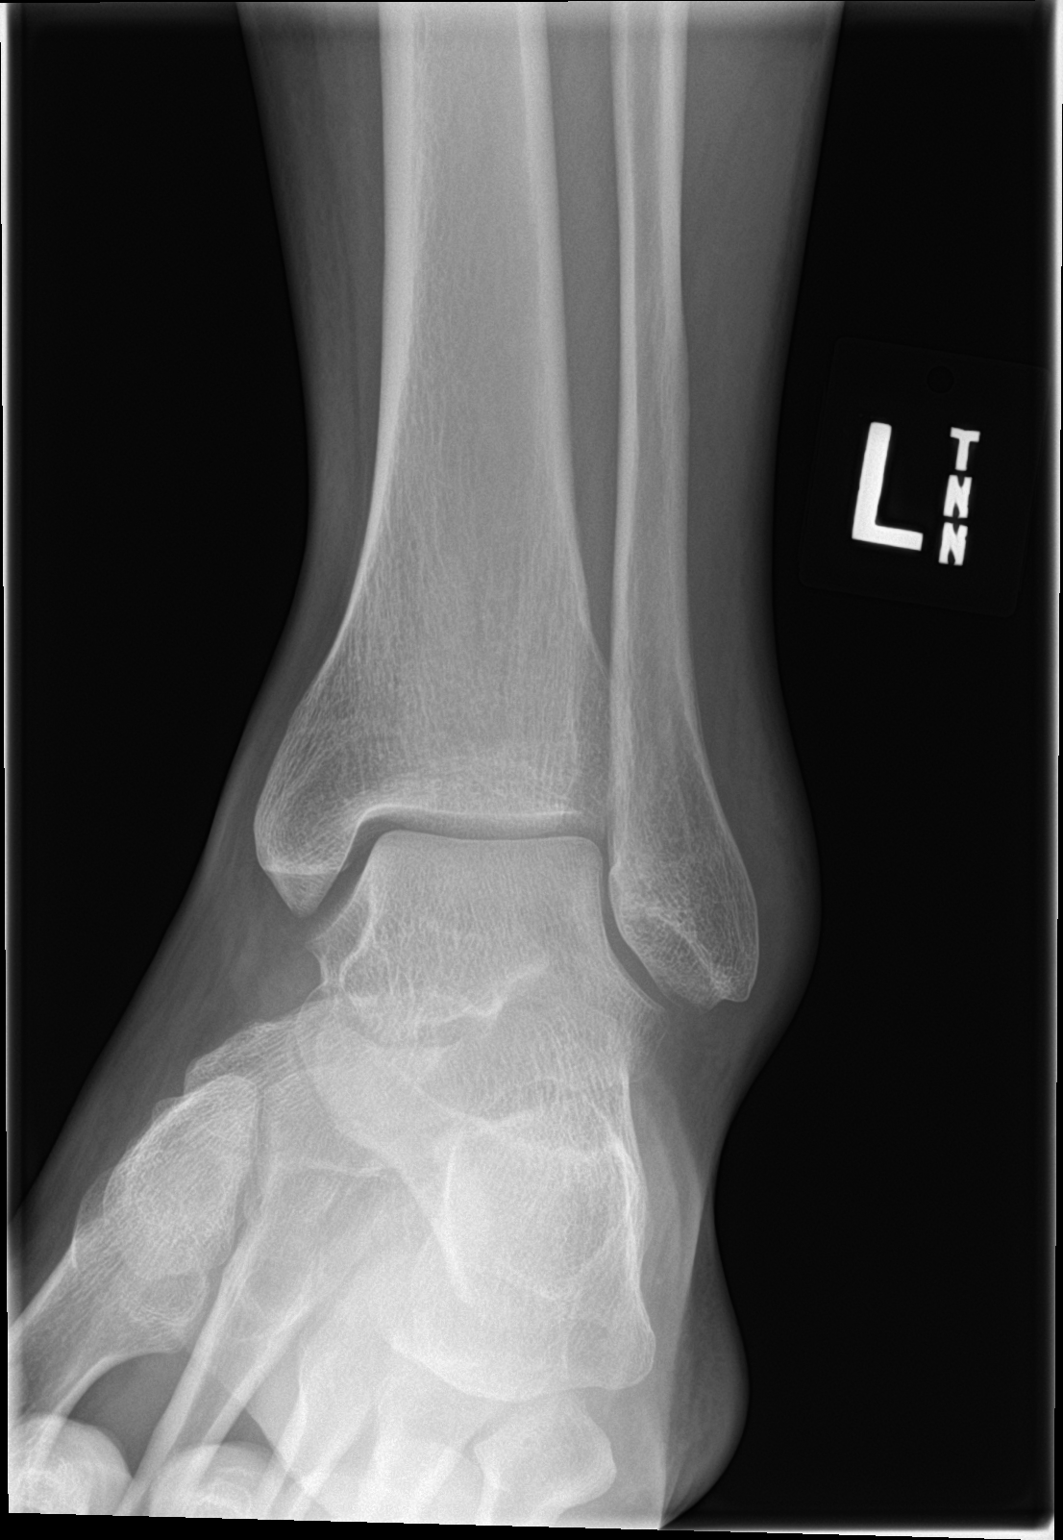

[ankle lat]
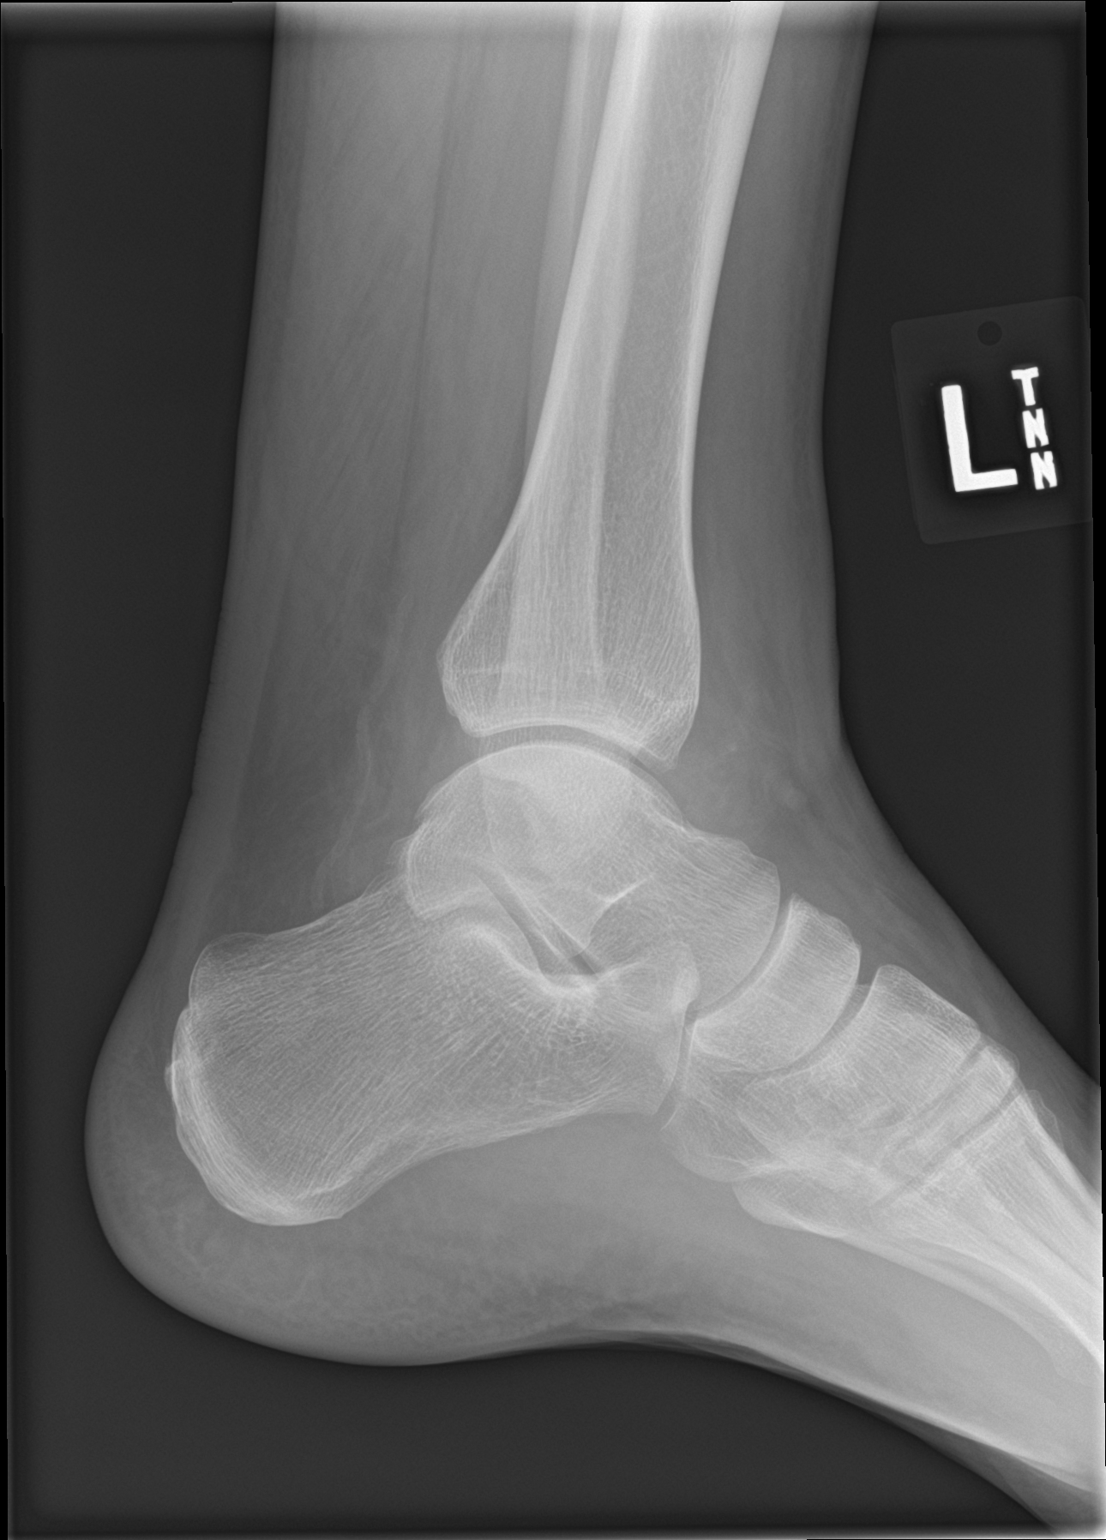

[3 of 3 positions shown; findings below may reference images not displayed]

FINDINGS: Moderate lateral soft tissue swelling. No acute displaced fracture
or malalignment. Ankle mortise is symmetric.
IMPRESSION: No acute osseous abnormality.  Lateral soft tissue swelling

## 2020-01-15 DEATH — deceased
